# Patient Record
Sex: Male | Born: 1970
Health system: Southern US, Community
[De-identification: ages and names within clinical notes are randomized; demographics above are authoritative.]

## PROBLEM LIST (undated history)

## (undated) DIAGNOSIS — F419 Anxiety disorder, unspecified: Secondary | ICD-10-CM

## (undated) DIAGNOSIS — K5900 Constipation, unspecified: Secondary | ICD-10-CM

## (undated) DIAGNOSIS — K219 Gastro-esophageal reflux disease without esophagitis: Secondary | ICD-10-CM

## (undated) HISTORY — DX: Anxiety disorder, unspecified: F41.9

## (undated) HISTORY — PX: VASECTOMY: SHX75

---

## 2000-05-03 HISTORY — PX: VASECTOMY: SHX75

## 2004-09-06 ENCOUNTER — Emergency Department (HOSPITAL_COMMUNITY): Admission: EM | Admit: 2004-09-06 | Discharge: 2004-09-06 | Payer: Self-pay | Admitting: Emergency Medicine

## 2009-07-21 ENCOUNTER — Ambulatory Visit: Payer: Self-pay | Admitting: Internal Medicine

## 2011-02-01 DIAGNOSIS — D229 Melanocytic nevi, unspecified: Secondary | ICD-10-CM

## 2011-02-01 HISTORY — DX: Melanocytic nevi, unspecified: D22.9

## 2015-04-21 ENCOUNTER — Encounter (HOSPITAL_COMMUNITY): Payer: Self-pay | Admitting: Emergency Medicine

## 2015-04-21 ENCOUNTER — Emergency Department (HOSPITAL_COMMUNITY): Payer: 59

## 2015-04-21 ENCOUNTER — Emergency Department (HOSPITAL_COMMUNITY)
Admission: EM | Admit: 2015-04-21 | Discharge: 2015-04-21 | Disposition: A | Discharge status: Home / Self Care | Payer: 59 | Attending: Emergency Medicine | Admitting: Emergency Medicine

## 2015-04-21 DIAGNOSIS — R1013 Epigastric pain: Secondary | ICD-10-CM | POA: Insufficient documentation

## 2015-04-21 DIAGNOSIS — R070 Pain in throat: Secondary | ICD-10-CM | POA: Diagnosis not present

## 2015-04-21 DIAGNOSIS — Z8719 Personal history of other diseases of the digestive system: Secondary | ICD-10-CM | POA: Diagnosis not present

## 2015-04-21 HISTORY — DX: Gastro-esophageal reflux disease without esophagitis: K21.9

## 2015-04-21 MED ORDER — FAMOTIDINE 20 MG PO TABS
20.0000 mg | ORAL_TABLET | Freq: Two times a day (BID) | ORAL | Status: DC
Start: 2015-04-21 — End: 2018-11-27

## 2015-04-21 MED ORDER — GI COCKTAIL ~~LOC~~
30.0000 mL | Freq: Once | ORAL | Status: AC
  Administered 2015-04-21: 30 mL via ORAL
  Filled 2015-04-21: qty 30

## 2015-04-21 NOTE — ED Provider Notes (Signed)
CSN: [id number]     Arrival date & time 04/21/15  0710 History   First MD Initiated Contact with Patient 04/21/15 0715     Chief Complaint  Patient presents with  . Swallowed Foreign Body     (Consider location/radiation/quality/duration/timing/severity/associated sxs/prior Treatment) HPI Comments: The patient is a 44 year old male, he denies any medical problems other than history of prior acid reflux disease.  He no longer takes medications for this stating that he improved spontaneously and stop taking the medications when his symptoms went away. He reports that 2 days ago he was eating pizza, he felt like he had some epigastric discomfort and since that time has had some discomfort in the top of his throat, he points just superior to the sternal notch. This is persistent, worse with swallowing, better when he lays down, worse when he sits up, worse when he swallows. There is no other associated symptoms including vomiting coughing or difficulty breathing.  Patient is a 44 y.o. male presenting with foreign body swallowed. The history is provided by the patient.  Swallowed Foreign Body    Past Medical History  Diagnosis Date  . GERD (gastroesophageal reflux disease)    Past Surgical History  Procedure Laterality Date  . Vasectomy     History reviewed. No pertinent family history. Social History  Substance Use Topics  . Smoking status: Never Smoker   . Smokeless tobacco: None  . Alcohol Use: Yes     Comment: occassionally    Review of Systems  All other systems reviewed and are negative.     Allergies  Review of patient's allergies indicates no known allergies.  Home Medications   Prior to Admission medications   Medication Sig Start Date End Date Taking? Authorizing Provider  famotidine (PEPCID) 20 MG tablet Take 1 tablet (20 mg total) by mouth 2 (two) times daily. 04/21/15   Noemi Chapel, MD   BP 124/80 mmHg  Pulse 69  Temp(Src) 98.1 F (36.7 C) (Oral)  Resp  18  Ht 6\' 2"  (1.88 m)  Wt 180 lb (81.647 kg)  BMI 23.10 kg/m2  SpO2 100% Physical Exam  Constitutional: He appears well-developed and well-nourished. No distress.  HENT:  Head: Normocephalic and atraumatic.  Mouth/Throat: Oropharynx is clear and moist. No oropharyngeal exudate.  Clear oropharynx, examined with tongue depressor, inspected, visualized, no signs of foreign body, bleeding, asymmetry, swelling, edema. Phonation is normal, dentition is normal, mucous membranes are moist. Tympanic membranes were reviewed and appear normal bilaterally  Eyes: Conjunctivae and EOM are normal. Pupils are equal, round, and reactive to light. Right eye exhibits no discharge. Left eye exhibits no discharge. No scleral icterus.  Neck: Normal range of motion. Neck supple. No JVD present. No thyromegaly present.  Very supple neck, no lymphadenopathy  Cardiovascular: Normal rate, regular rhythm, normal heart sounds and intact distal pulses.  Exam reveals no gallop and no friction rub.   No murmur heard. Pulmonary/Chest: Effort normal and breath sounds normal. No respiratory distress. He has no wheezes. He has no rales.  Abdominal: Soft. Bowel sounds are normal. He exhibits no distension and no mass. There is no tenderness.  Musculoskeletal: Normal range of motion.  Lymphadenopathy:    He has no cervical adenopathy.  Neurological: He is alert. Coordination normal.  Normal gait and balance  Skin: Skin is warm and dry. No rash noted. No erythema.  Psychiatric: He has a normal mood and affect. His behavior is normal.  Nursing note and vitals reviewed.   ED  Course  Procedures (including critical care time) Labs Review Labs Reviewed - No data to display  Imaging Review Dg Neck Soft Tissue  04/21/2015  CLINICAL DATA:  The patient feels as if something is stuck in his throat since eating pizza 04/20/2015. Initial encounter. EXAM: NECK SOFT TISSUES - 1+ VIEW COMPARISON:  None. FINDINGS: There is no evidence  of retropharyngeal soft tissue swelling or epiglottic enlargement. The cervical airway is unremarkable and no radio-opaque foreign body identified. IMPRESSION: Negative. Electronically Signed   By: Inge Rise M.D.   On: 04/21/2015 07:46   I have personally reviewed and evaluated these images and lab results as part of my medical decision-making.    MDM   Final diagnoses:  Throat pain    Overall the patient is well-appearing, vital signs are normal, doubt foreign body, more likely to be acid reflux, however will image the neck to rule out foreign body or other complicating factors. The patient is in agreement with this plan.  Xray n eg, pt stable for d/c.  Meds given in ED:  Medications  gi cocktail (Maalox,Lidocaine,Donnatal) (30 mLs Oral Given 04/21/15 0729)    New Prescriptions   FAMOTIDINE (PEPCID) 20 MG TABLET    Take 1 tablet (20 mg total) by mouth 2 (two) times daily.        Noemi Chapel, MD 04/21/15 [id number]

## 2015-04-21 NOTE — ED Notes (Signed)
PT stated he ate pizza Saturday evening and ever since then he feels like something is stuck in his throat. PT denies any vomiting and is able to drink fluids. NAD noted.

## 2015-04-29 ENCOUNTER — Encounter (HOSPITAL_COMMUNITY): Payer: Self-pay | Admitting: Emergency Medicine

## 2015-04-29 DIAGNOSIS — R1013 Epigastric pain: Secondary | ICD-10-CM | POA: Insufficient documentation

## 2015-04-29 DIAGNOSIS — Z79899 Other long term (current) drug therapy: Secondary | ICD-10-CM | POA: Diagnosis not present

## 2015-04-29 DIAGNOSIS — K219 Gastro-esophageal reflux disease without esophagitis: Secondary | ICD-10-CM | POA: Diagnosis not present

## 2015-04-29 NOTE — ED Notes (Signed)
Pt c/o abd pain since Friday. Pt has seen Pcp for the same.

## 2015-04-30 ENCOUNTER — Emergency Department (HOSPITAL_COMMUNITY)
Admission: EM | Admit: 2015-04-30 | Discharge: 2015-04-30 | Disposition: A | Discharge status: Home / Self Care | Payer: 59 | Attending: Emergency Medicine | Admitting: Emergency Medicine

## 2015-04-30 DIAGNOSIS — R1013 Epigastric pain: Secondary | ICD-10-CM

## 2015-04-30 MED ORDER — HYDROCODONE-ACETAMINOPHEN 5-325 MG PO TABS: 1.0000 | ORAL_TABLET | ORAL | Status: DC | PRN

## 2015-04-30 NOTE — Discharge Instructions (Signed)
Take Nexium twice a day for 3 weeks. Return for the HIDA scan, after discussion with tomorrow morning.   Abdominal Pain, Adult Many things can cause abdominal pain. Usually, abdominal pain is not caused by a disease and will improve without treatment. It can often be observed and treated at home. Your health care provider will do a physical exam and possibly order blood tests and X-rays to help determine the seriousness of your pain. However, in many cases, more time must pass before a clear cause of the pain can be found. Before that point, your health care provider may not know if you need more testing or further treatment. HOME CARE INSTRUCTIONS Monitor your abdominal pain for any changes. The following actions may help to alleviate any discomfort you are experiencing:  Only take over-the-counter or prescription medicines as directed by your health care provider.  Do not take laxatives unless directed to do so by your health care provider.  Try a clear liquid diet (broth, tea, or water) as directed by your health care provider. Slowly move to a bland diet as tolerated. SEEK MEDICAL CARE IF:  You have unexplained abdominal pain.  You have abdominal pain associated with nausea or diarrhea.  You have pain when you urinate or have a bowel movement.  You experience abdominal pain that wakes you in the night.  You have abdominal pain that is worsened or improved by eating food.  You have abdominal pain that is worsened with eating fatty foods.  You have a fever. SEEK IMMEDIATE MEDICAL CARE IF:  Your pain does not go away within 2 hours.  You keep throwing up (vomiting).  Your pain is felt only in portions of the abdomen, such as the right side or the left lower portion of the abdomen.  You pass bloody or black tarry stools. MAKE SURE YOU:  Understand these instructions.  Will watch your condition.  Will get help right away if you are not doing well or get worse.   This  information is not intended to replace advice given to you by your health care provider. Make sure you discuss any questions you have with your health care provider.   Document Released: 01/27/2005 Document Revised: 01/08/2015 Document Reviewed: 12/27/2012 Elsevier Interactive Patient Education [employer].

## 2015-04-30 NOTE — ED Notes (Signed)
Patient states that he has been here a week ago for pain in the center of his chest and they told him it was reflux. States that he went to see Dr. Olena Heckle today and they did lab work and an ultrasound and everything was fine. States that he is having on going epigastric pain, that he had an EGD 5 years ago and it was fine. He has not been referred to GI at this time.

## 2015-04-30 NOTE — ED Provider Notes (Signed)
CSN: [id number]     Arrival date & time 04/29/15  2338 History   First MD Initiated Contact with Patient 04/30/15 0129     Chief Complaint  Patient presents with  . Abdominal Pain     (Consider location/radiation/quality/duration/timing/severity/associated sxs/prior Treatment) HPI   Dillon Payne is a 44 y.o. male who presents for evaluation of epigastric pain which is persistent for 2 weeks. It has worsened in the last 3 days. His PCP ordered an ultrasound which was done and he was told that it was normal. He is worried that he has gallbladder disease. He is taking famotidine, twice a day for 2 weeks without relief. He's tried Tums, without relief. He denies fever, nausea, vomiting, weakness, dizziness, diarrhea or constipation. He does not smoke. He is a Administrator. He has an upcoming appointment with gastroenterology on 05/07/15. There are no other known modifying factors.   Past Medical History  Diagnosis Date  . GERD (gastroesophageal reflux disease)    Past Surgical History  Procedure Laterality Date  . Vasectomy     History reviewed. No pertinent family history. Social History  Substance Use Topics  . Smoking status: Never Smoker   . Smokeless tobacco: None  . Alcohol Use: Yes     Comment: occassionally    Review of Systems  All other systems reviewed and are negative.     Allergies  Review of patient's allergies indicates no known allergies.  Home Medications   Prior to Admission medications   Medication Sig Start Date End Date Taking? Authorizing Provider  famotidine (PEPCID) 20 MG tablet Take 1 tablet (20 mg total) by mouth 2 (two) times daily. 04/21/15   Noemi Chapel, MD   BP 118/85 mmHg  Pulse 63  Temp(Src) 98 F (36.7 C) (Oral)  Resp 14  Ht 6\' 2"  (1.88 m)  Wt 180 lb (81.647 kg)  BMI 23.10 kg/m2  SpO2 100% Physical Exam  Constitutional: He is oriented to person, place, and time. He appears well-developed and well-nourished.  HENT:  Head:  Normocephalic and atraumatic.  Right Ear: External ear normal.  Left Ear: External ear normal.  Eyes: Conjunctivae and EOM are normal. Pupils are equal, round, and reactive to light.  Neck: Normal range of motion and phonation normal. Neck supple.  Cardiovascular: Normal rate, regular rhythm and normal heart sounds.   Pulmonary/Chest: Effort normal and breath sounds normal. He exhibits no bony tenderness.  Abdominal: Soft. There is tenderness (epigastric, mild). There is no rebound and no guarding.  Musculoskeletal: Normal range of motion.  Neurological: He is alert and oriented to person, place, and time. No cranial nerve deficit or sensory deficit. He exhibits normal muscle tone. Coordination normal.  Skin: Skin is warm, dry and intact.  Psychiatric: He has a normal mood and affect. His behavior is normal. Judgment and thought content normal.  Nursing note and vitals reviewed.   ED Course  Procedures (including critical care time)  Medications - No data to display  Patient Vitals for the past 24 hrs:  BP Temp Temp src Pulse Resp SpO2 Height Weight  04/29/15 2351 118/85 mmHg 98 F (36.7 C) Oral 63 14 100 % 6\' 2"  (1.88 m) 180 lb (81.647 kg)   Radiology was contacted regarding scheduling a HIDA scan. They will contact him in the morning with the time.  1:47 AM Reevaluation with update and discussion. After initial assessment and treatment, an updated evaluation reveals no change in status. Findings discussed with patient, all questions were  answered. [facility] Review Labs Reviewed - No data to display  Imaging Review No results found. I have personally reviewed and evaluated these images and lab results as part of my medical decision-making.   EKG Interpretation None      MDM   Final diagnoses:  Epigastric pain    Nonspecific epigastric pain, likely related to peptic ulcer disease. Small likelihood of dysfunctional gallbladder is the source. Doubt serious  bacterial infection, metabolic instability or impending vascular collapse.  Nursing Notes Reviewed/ Care Coordinated Applicable Imaging Reviewed Interpretation of Laboratory Data incorporated into ED treatment  The patient appears reasonably screened and/or stabilized for discharge and I doubt any other medical condition or other [facility] requiring further screening, evaluation, or treatment in the ED at this time prior to discharge.  Plan: Home Medications- Start Nexium OTC, Rx Norco; Home Treatments- rest; return here if the recommended treatment, does not improve the symptoms; Recommended follow up- HIDA scan tomorrow. GI on 05/07/15 as scheduled     Daleen Bo, MD 04/30/15 [phone]

## 2015-05-15 ENCOUNTER — Encounter (HOSPITAL_COMMUNITY): Payer: Self-pay | Admitting: Emergency Medicine

## 2015-05-15 ENCOUNTER — Emergency Department (HOSPITAL_COMMUNITY): Payer: 59

## 2015-05-15 ENCOUNTER — Emergency Department (HOSPITAL_COMMUNITY)
Admission: EM | Admit: 2015-05-15 | Discharge: 2015-05-15 | Disposition: A | Discharge status: Home / Self Care | Payer: 59 | Attending: Emergency Medicine | Admitting: Emergency Medicine

## 2015-05-15 DIAGNOSIS — K219 Gastro-esophageal reflux disease without esophagitis: Secondary | ICD-10-CM | POA: Insufficient documentation

## 2015-05-15 DIAGNOSIS — R1031 Right lower quadrant pain: Secondary | ICD-10-CM | POA: Diagnosis not present

## 2015-05-15 DIAGNOSIS — Z79899 Other long term (current) drug therapy: Secondary | ICD-10-CM | POA: Diagnosis not present

## 2015-05-15 HISTORY — DX: Constipation, unspecified: K59.00

## 2015-05-15 LAB — CBC
HCT: 43.3 % (ref 39.0–52.0)
HEMOGLOBIN: 14.9 g/dL (ref 13.0–17.0)
MCH: 30.6 pg (ref 26.0–34.0)
MCHC: 34.4 g/dL (ref 30.0–36.0)
MCV: 88.9 fL (ref 78.0–100.0)
PLATELETS: 192 10*3/uL (ref 150–400)
RBC: 4.87 MIL/uL (ref 4.22–5.81)
RDW: 13 % (ref 11.5–15.5)
WBC: 4.7 10*3/uL (ref 4.0–10.5)

## 2015-05-15 LAB — URINALYSIS, ROUTINE W REFLEX MICROSCOPIC
BILIRUBIN URINE: NEGATIVE
Glucose, UA: NEGATIVE mg/dL
Hgb urine dipstick: NEGATIVE
KETONES UR: NEGATIVE mg/dL
Leukocytes, UA: NEGATIVE
NITRITE: NEGATIVE
PROTEIN: NEGATIVE mg/dL
SPECIFIC GRAVITY, URINE: 1.018 (ref 1.005–1.030)
pH: 7 (ref 5.0–8.0)

## 2015-05-15 LAB — COMPREHENSIVE METABOLIC PANEL
ALK PHOS: 66 U/L (ref 38–126)
ALT: 15 U/L — ABNORMAL LOW (ref 17–63)
ANION GAP: 7 (ref 5–15)
AST: 18 U/L (ref 15–41)
Albumin: 3.8 g/dL (ref 3.5–5.0)
BILIRUBIN TOTAL: 0.9 mg/dL (ref 0.3–1.2)
BUN: 13 mg/dL (ref 6–20)
CALCIUM: 9.3 mg/dL (ref 8.9–10.3)
CO2: 26 mmol/L (ref 22–32)
CREATININE: 0.92 mg/dL (ref 0.61–1.24)
Chloride: 106 mmol/L (ref 101–111)
GFR calc non Af Amer: >60 mL/min (ref >60)
GLUCOSE: 99 mg/dL (ref 65–99)
Potassium: 3.9 mmol/L (ref 3.5–5.1)
Sodium: 139 mmol/L (ref 135–145)
TOTAL PROTEIN: 6.7 g/dL (ref 6.5–8.1)

## 2015-05-15 LAB — LIPASE, BLOOD: Lipase: 26 U/L (ref 11–51)

## 2015-05-15 MED ORDER — ONDANSETRON HCL 4 MG/2ML IJ SOLN
4.0000 mg | Freq: Once | INTRAMUSCULAR | Status: AC
  Administered 2015-05-15: 4 mg via INTRAVENOUS
  Filled 2015-05-15: qty 2

## 2015-05-15 MED ORDER — SODIUM CHLORIDE 0.9 % IV BOLUS (SEPSIS)
1000.0000 mL | Freq: Once | INTRAVENOUS | Status: AC
  Administered 2015-05-15: 1000 mL via INTRAVENOUS

## 2015-05-15 MED ORDER — IOHEXOL 300 MG/ML  SOLN
100.0000 mL | Freq: Once | INTRAMUSCULAR | Status: AC | PRN
  Administered 2015-05-15: 100 mL via INTRAVENOUS

## 2015-05-15 MED ORDER — MORPHINE SULFATE (PF) 4 MG/ML IV SOLN
4.0000 mg | Freq: Once | INTRAVENOUS | Status: AC
  Administered 2015-05-15: 4 mg via INTRAVENOUS
  Filled 2015-05-15: qty 1

## 2015-05-15 NOTE — ED Notes (Signed)
Patient states has been having R abdominal pain and R flank pain x 4 days.  Denies urinary symptoms.   Patient denies other symptoms.

## 2015-05-15 NOTE — ED Provider Notes (Signed)
CSN: [id number]     Arrival date & time 05/15/15  [id number] History   First MD Initiated Contact with Patient 05/15/15 1303     Chief Complaint  Patient presents with  . Abdominal Pain  . Flank Pain     (Consider location/radiation/quality/duration/timing/severity/associated sxs/prior Treatment) Patient is a 45 y.o. male presenting with abdominal pain and flank pain. The history is provided by the patient.  Abdominal Pain Pain location:  RLQ Pain quality: sharp and shooting   Pain radiates to:  Does not radiate Pain severity:  Moderate Onset quality:  Gradual Duration:  4 days Timing:  Constant Progression:  Worsening Chronicity:  Recurrent (multiple episodes after last couple months) Relieved by: rest. Worsened by:  Movement, palpation and coughing Ineffective treatments:  None tried Associated symptoms: no chest pain, no chills, no diarrhea, no fatigue, no fever, no nausea, no shortness of breath and no vomiting   Risk factors: has not had multiple surgeries and no recent hospitalization   Flank Pain Associated symptoms include abdominal pain. Pertinent negatives include no chest pain, no headaches and no shortness of breath.    44 yo M with RLQ abdominal pain.  Going on for four days.  Worse with movement, palpation, bending, coughing.  Sharp pain.  Sometimes radiates to R flank.  Denies radiation to groin.  No pain with urination.  Denies fevers, chills.  Able to eat and drink without difficulty.  Denies n/v/d.  Denies injury.  Denies dark stool or blood in stool.   Past Medical History  Diagnosis Date  . GERD (gastroesophageal reflux disease)   . Constipation    Past Surgical History  Procedure Laterality Date  . Vasectomy     No family history on file. Social History  Substance Use Topics  . Smoking status: Never Smoker   . Smokeless tobacco: None  . Alcohol Use: Yes     Comment: occassionally    Review of Systems  Constitutional: Negative for fever, chills and  fatigue.  HENT: Negative for congestion and facial swelling.   Eyes: Negative for discharge and visual disturbance.  Respiratory: Negative for shortness of breath.   Cardiovascular: Negative for chest pain and palpitations.  Gastrointestinal: Positive for abdominal pain. Negative for nausea, vomiting and diarrhea.  Genitourinary: Positive for flank pain.  Musculoskeletal: Negative for myalgias and arthralgias.  Skin: Negative for color change and rash.  Neurological: Negative for tremors, syncope and headaches.  Psychiatric/Behavioral: Negative for confusion and dysphoric mood.      Allergies  Review of patient's allergies indicates no known allergies.  Home Medications   Prior to Admission medications   Medication Sig Start Date End Date Taking? Authorizing Provider  citalopram (CELEXA) 20 MG tablet Take 20 mg by mouth daily.   Yes Historical Provider, MD  famotidine (PEPCID) 20 MG tablet Take 1 tablet (20 mg total) by mouth 2 (two) times daily. 04/21/15  Yes Noemi Chapel, MD  HYDROcodone-acetaminophen (NORCO) 5-325 MG tablet Take 1-2 tablets by mouth every 4 (four) hours as needed. Patient not taking: Reported on 05/15/2015 04/30/15   Daleen Bo, MD  HYDROcodone-acetaminophen (NORCO/VICODIN) 5-325 MG tablet Take 1-2 tablets by mouth every 4 (four) hours as needed. Patient not taking: Reported on 05/15/2015 04/30/15   Daleen Bo, MD   BP 103/72 mmHg  Pulse 66  Temp(Src) 97.5 F (36.4 C) (Oral)  Resp 20  Ht 6\' 2"  (1.88 m)  Wt 181 lb (82.101 kg)  BMI 23.23 kg/m2  SpO2 100% Physical Exam  Constitutional:  He is oriented to person, place, and time. He appears well-developed and well-nourished.  HENT:  Head: Normocephalic and atraumatic.  Eyes: EOM are normal. Pupils are equal, round, and reactive to light.  Neck: Normal range of motion. Neck supple. No JVD present.  Cardiovascular: Normal rate and regular rhythm.  Exam reveals no gallop and no friction rub.   No murmur  heard. Pulmonary/Chest: No respiratory distress. He has no wheezes.  Abdominal: He exhibits no distension. There is tenderness. There is no rebound and no guarding. Hernia confirmed negative in the right inguinal area and confirmed negative in the left inguinal area.    Genitourinary: Testes normal and penis normal. Cremasteric reflex is present. Right testis shows no mass, no swelling and no tenderness. Left testis shows no mass, no swelling and no tenderness. Circumcised.  Musculoskeletal: Normal range of motion.  Neurological: He is alert and oriented to person, place, and time.  Skin: No rash noted. No pallor.  Psychiatric: He has a normal mood and affect. His behavior is normal.  Nursing note and vitals reviewed.   ED Course  Procedures (including critical care time) Labs Review Labs Reviewed  COMPREHENSIVE METABOLIC PANEL - Abnormal; Notable for the following:    ALT 15 (*)    All other components within normal limits  LIPASE, BLOOD  CBC  URINALYSIS, ROUTINE W REFLEX MICROSCOPIC (NOT AT [facility])    Imaging Review No results found. I have personally reviewed and evaluated these images and lab results as part of my medical decision-making.   EKG Interpretation None      MDM   Final diagnoses:  Right lower quadrant abdominal pain    45 yo M with RLQ pain.  No prior surgery.  TTP about [place] with + rosvigs.  Lab eval unremarkable.  UA negative for infection.  No hematuria.  CT scan negative for acute pathology.  No hernias on exam or ct.  Feel likely musculoskeletal.  Will treat as such.  PCP follow up.    I have discussed the diagnosis/risks/treatment options with the patient and family and believe the pt to be eligible for discharge home to follow-up with PCP. We also discussed returning to the ED immediately if new or worsening sx occur. We discussed the sx which are most concerning (e.g., sudden worsening pain, fever, inability to tolerate by mouth) that necessitate  immediate return. Medications administered to the patient during their visit and any new prescriptions provided to the patient are listed below.  Medications given during this visit Medications  morphine 4 MG/ML injection 4 mg (4 mg Intravenous Given 05/15/15 1335)  ondansetron (ZOFRAN) injection 4 mg (4 mg Intravenous Given 05/15/15 1335)  sodium chloride 0.9 % bolus 1,000 mL (0 mLs Intravenous Stopped 05/15/15 1503)  iohexol (OMNIPAQUE) 300 MG/ML solution 100 mL (100 mLs Intravenous Contrast Given 05/15/15 1528)    Discharge Medication List as of 05/15/2015  4:01 PM      The patient appears reasonably screen and/or stabilized for discharge and I doubt any other medical condition or other [facility] requiring further screening, evaluation, or treatment in the ED at this time prior to discharge.      Deno Etienne, DO 05/15/15 1826

## 2015-05-15 NOTE — Discharge Instructions (Signed)

## 2017-02-02 IMAGING — CT CT ABD-PELV W/ CM
2 of 5 series · 16 of 46 positions shown, 18 images · IV contrast (Omni 300)
Comparison: 04/30/2015

CLINICAL DATA: RIGHT-side abdominal and RIGHT flank pain for 4
days, RIGHT lower quadrant pain, question appendicitis, no urinary
tract symptoms

EXAM:
CT ABDOMEN AND PELVIS WITH CONTRAST
TECHNIQUE: Multidetector CT imaging of the abdomen and pelvis was performed
using the standard protocol following bolus administration of
intravenous contrast. Sagittal and coronal MPR images reconstructed
from axial data set.
CONTRAST:  100mL OMNIPAQUE IOHEXOL 300 MG/ML SOLN IV. No oral
contrast administered.

[Series 2: a/p w/ 5mm · axial · 0.73mm/px · z∈[-618,-163]mm · 13 of 105 slices shown, 15 images]
[im 7/105  soft-tissue]
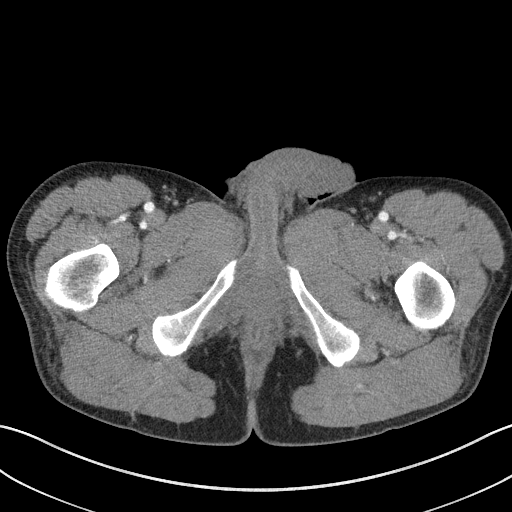
[im 7/105  bone]
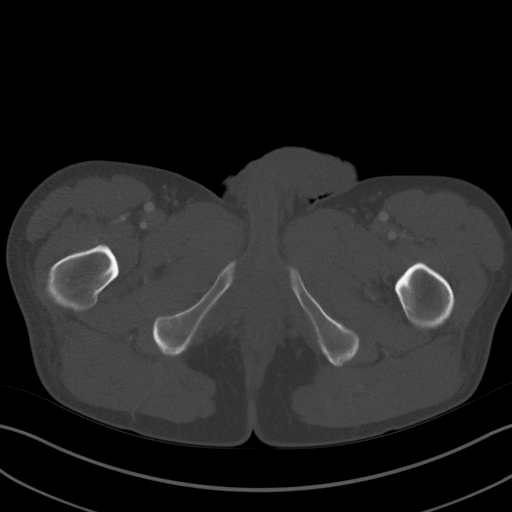
[im 13/105  soft-tissue]
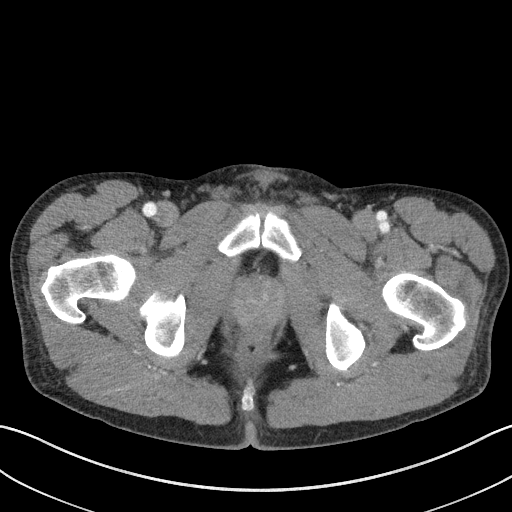
[im 25/105  soft-tissue]
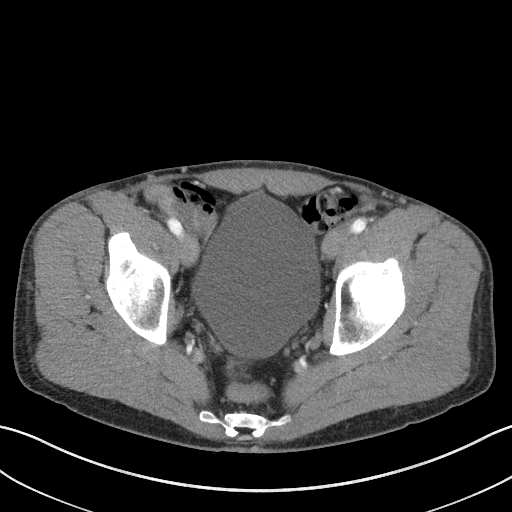
[im 31/105  soft-tissue]
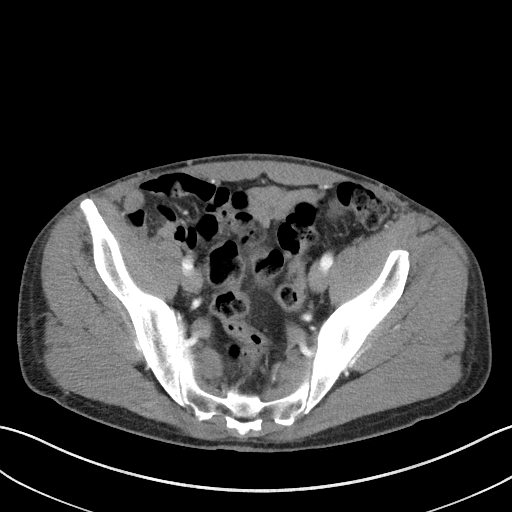
[im 37/105  soft-tissue]
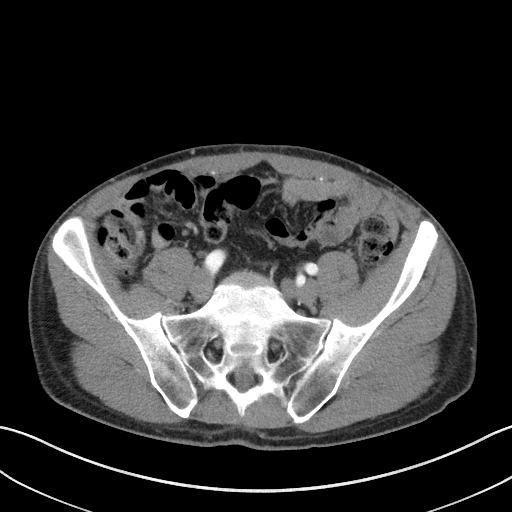
[im 43/105  soft-tissue]
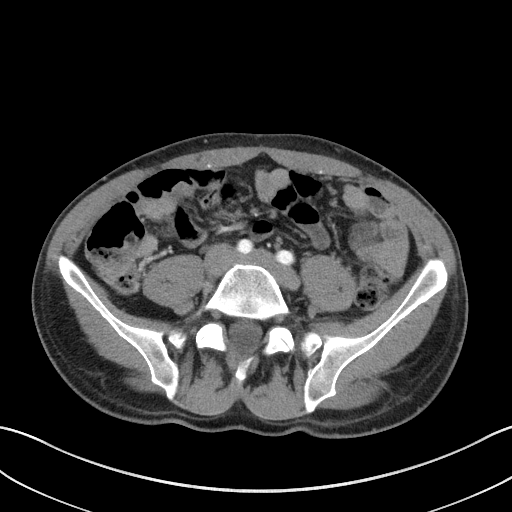
[im 56/105  soft-tissue]
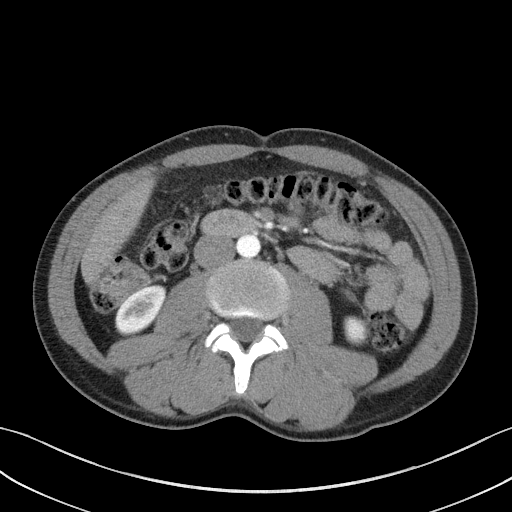
[im 62/105  soft-tissue]
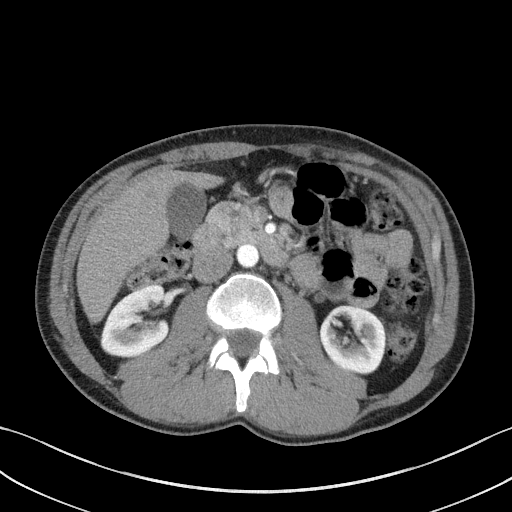
[im 68/105  soft-tissue]
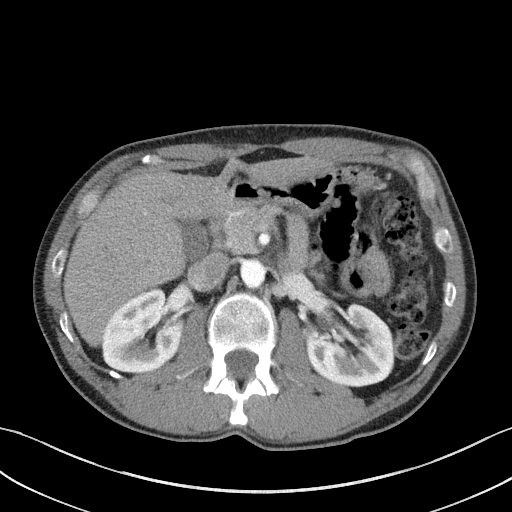
[im 68/105  bone]
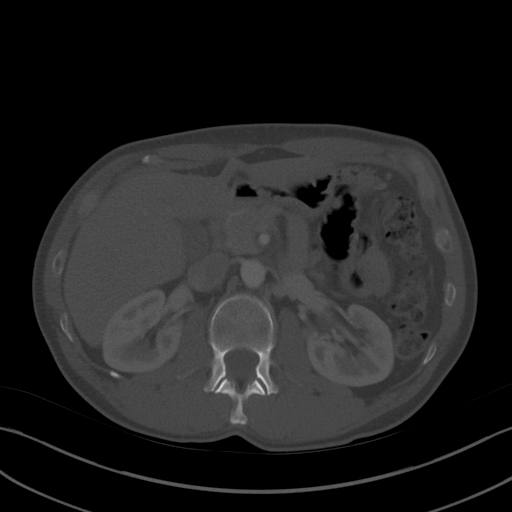
[im 74/105  soft-tissue]
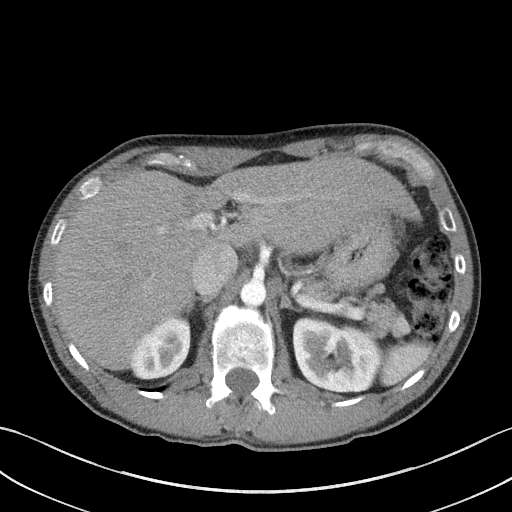
[im 80/105  soft-tissue]
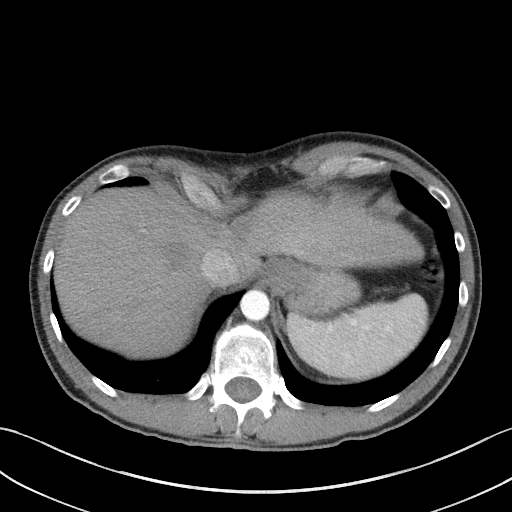
[im 92/105  soft-tissue]
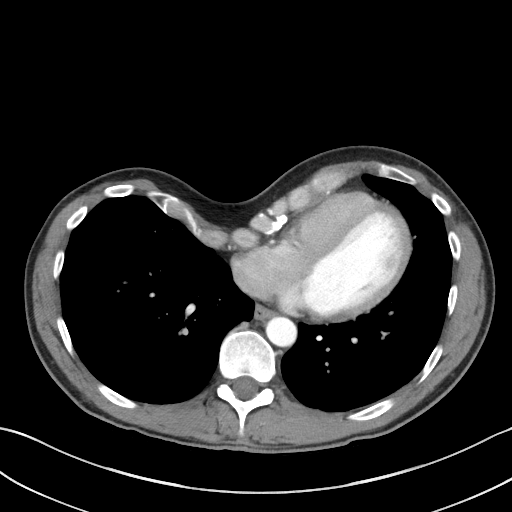
[im 98/105  soft-tissue]
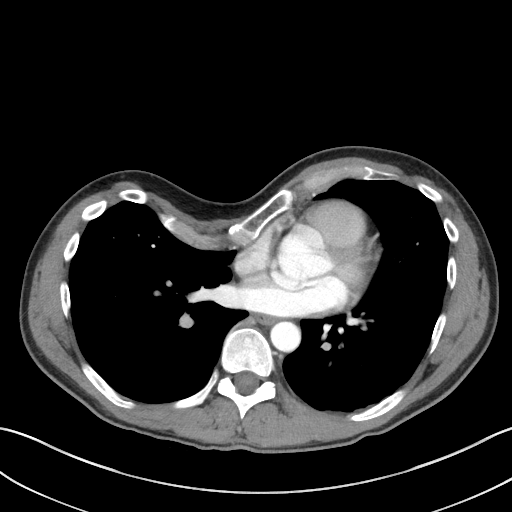

[Series 5: a/p w/ cor · coronal · 0.75mm/px · 3 of 124 slices shown]
[im 42/124  soft-tissue]
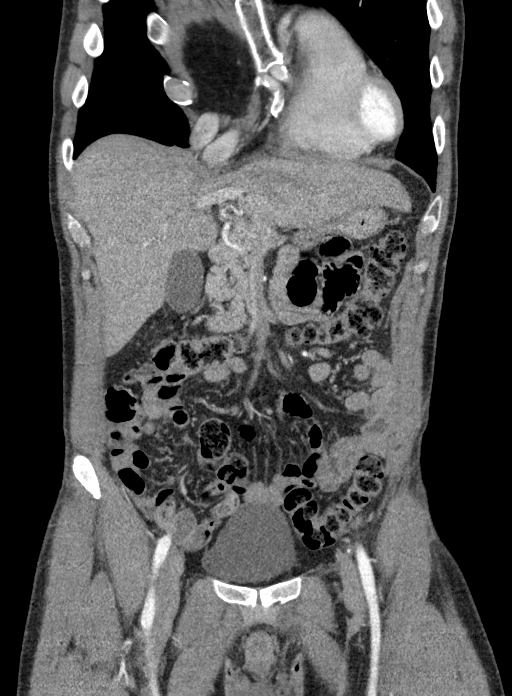
[im 55/124  soft-tissue]
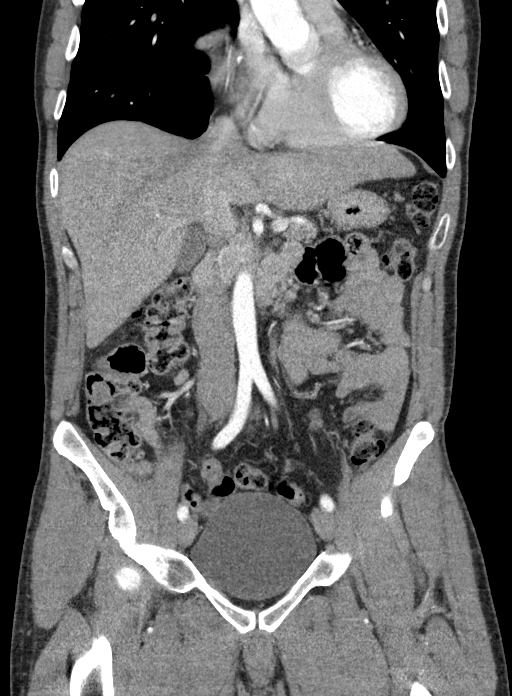
[im 69/124  soft-tissue]
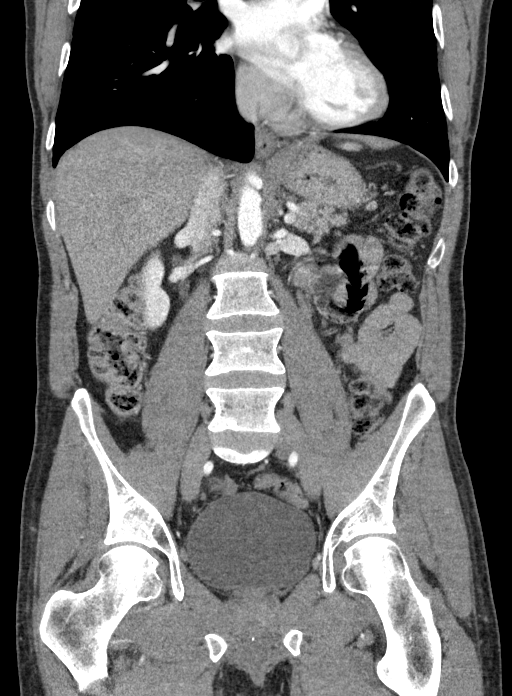

[16 of 46 positions shown; findings below may reference images not displayed]

FINDINGS: Lung bases clear.

Pectus excavatum.

Liver, gallbladder, spleen, pancreas, kidneys, and adrenal glands
normal appearance.

Normal appearing bladder and ureters.

Stomach and bowel loops grossly unremarkable for exam lacking GI
contrast and adequate distention.

Normal appendix, best visualized on coronal images.

No mass, adenopathy, free air, free fluid, or inflammatory process.

No acute osseous findings.
IMPRESSION: No acute intra-abdominal or intrapelvic abnormalities.

Pectus excavatum.

## 2018-01-23 DIAGNOSIS — Z6822 Body mass index (BMI) 22.0-22.9, adult: Secondary | ICD-10-CM | POA: Diagnosis not present

## 2018-01-23 DIAGNOSIS — J209 Acute bronchitis, unspecified: Secondary | ICD-10-CM | POA: Diagnosis not present

## 2018-04-27 DIAGNOSIS — Z0001 Encounter for general adult medical examination with abnormal findings: Secondary | ICD-10-CM | POA: Diagnosis not present

## 2018-04-27 DIAGNOSIS — Z6822 Body mass index (BMI) 22.0-22.9, adult: Secondary | ICD-10-CM | POA: Diagnosis not present

## 2018-10-03 DIAGNOSIS — L219 Seborrheic dermatitis, unspecified: Secondary | ICD-10-CM | POA: Diagnosis not present

## 2018-10-24 DIAGNOSIS — R109 Unspecified abdominal pain: Secondary | ICD-10-CM | POA: Diagnosis not present

## 2018-10-31 DIAGNOSIS — L239 Allergic contact dermatitis, unspecified cause: Secondary | ICD-10-CM | POA: Diagnosis not present

## 2018-10-31 DIAGNOSIS — L308 Other specified dermatitis: Secondary | ICD-10-CM | POA: Diagnosis not present

## 2018-10-31 DIAGNOSIS — D485 Neoplasm of uncertain behavior of skin: Secondary | ICD-10-CM | POA: Diagnosis not present

## 2018-11-27 ENCOUNTER — Ambulatory Visit (INDEPENDENT_AMBULATORY_CARE_PROVIDER_SITE_OTHER): Payer: BC Managed Care – PPO | Admitting: Podiatry

## 2018-11-27 ENCOUNTER — Ambulatory Visit (INDEPENDENT_AMBULATORY_CARE_PROVIDER_SITE_OTHER): Payer: BC Managed Care – PPO

## 2018-11-27 ENCOUNTER — Other Ambulatory Visit: Payer: Self-pay

## 2018-11-27 ENCOUNTER — Encounter: Payer: Self-pay | Admitting: Podiatry

## 2018-11-27 VITALS — BP 87/65 | HR 68 | Temp 98.3°F | Resp 16

## 2018-11-27 DIAGNOSIS — M778 Other enthesopathies, not elsewhere classified: Secondary | ICD-10-CM

## 2018-11-27 DIAGNOSIS — M779 Enthesopathy, unspecified: Secondary | ICD-10-CM

## 2018-11-27 MED ORDER — DICLOFENAC SODIUM 75 MG PO TBEC: 75.0000 mg | DELAYED_RELEASE_TABLET | Freq: Two times a day (BID) | ORAL | 2 refills | Status: DC

## 2018-11-27 NOTE — Progress Notes (Signed)
   Subjective:    Patient ID: Dillon Payne, male    DOB: 24-Aug-1970, 48 y.o.   MRN: [id number]  HPI    Review of Systems  All other systems reviewed and are negative.      Objective:   Physical Exam        Assessment & Plan:

## 2018-11-28 NOTE — Progress Notes (Signed)
Subjective:   Patient ID: Dillon Payne, male   DOB: 48 y.o.   MRN: [id number]   HPI Patient states he developed a lot of pain in his right forefoot and it is been going on for the last few days and it seems to be slightly better but it still very tender.  Patient does not smoke likes to be active   Review of Systems  All other systems reviewed and are negative.       Objective:  Physical Exam Vitals signs and nursing note reviewed.  Constitutional:      Appearance: He is well-developed.  Pulmonary:     Effort: Pulmonary effort is normal.  Musculoskeletal: Normal range of motion.  Skin:    General: Skin is warm.  Neurological:     Mental Status: He is alert.     Neurovascular status intact muscle strength is adequate range of motion within normal limits with patient found to have exquisite discomfort in the third MPJ and also the distal shaft of the third metatarsal right.  There is moderate swelling around the area but it does seem to be improving mildly from several days ago     Assessment:  Possibility for inflammatory condition versus possibility for a systemic condition or stress fracture or capsulitis     Plan:  H&P reviewed all conditions and x-rays and today I did place him on diclofenac 75 mg twice daily rigid bottom shoes ice and if symptoms do not improve we will consider either capsular injection or immobilization or blood work or combination of the above  X-rays indicate that there is no signs of fracture third metatarsal or inflammatory arthritic condition around the third MPJ

## 2019-05-03 DIAGNOSIS — L300 Nummular dermatitis: Secondary | ICD-10-CM | POA: Diagnosis not present

## 2019-10-20 DIAGNOSIS — Z6821 Body mass index (BMI) 21.0-21.9, adult: Secondary | ICD-10-CM | POA: Diagnosis not present

## 2019-10-20 DIAGNOSIS — M778 Other enthesopathies, not elsewhere classified: Secondary | ICD-10-CM | POA: Diagnosis not present

## 2020-06-25 ENCOUNTER — Telehealth: Payer: Self-pay | Admitting: Physician Assistant

## 2020-06-25 MED ORDER — TRIAMCINOLONE ACETONIDE 0.1 % EX CREA: 1.0000 "application " | TOPICAL_CREAM | Freq: Every day | CUTANEOUS | 3 refills | Status: DC

## 2020-06-25 NOTE — Telephone Encounter (Signed)
Unable to get Trianex at affordable price for patient- okay per Robyne Askew to switch to Triamcinolone- patient aware and medication sent to patient pharmacy.

## 2020-06-25 NOTE — Telephone Encounter (Signed)
Patient wants refill of trianex

## 2020-12-04 ENCOUNTER — Other Ambulatory Visit: Payer: Self-pay

## 2020-12-04 ENCOUNTER — Other Ambulatory Visit: Payer: Self-pay | Admitting: Physician Assistant

## 2020-12-04 ENCOUNTER — Ambulatory Visit: Payer: BC Managed Care – PPO | Admitting: Physician Assistant

## 2020-12-04 ENCOUNTER — Encounter: Payer: Self-pay | Admitting: Physician Assistant

## 2020-12-04 DIAGNOSIS — L564 Polymorphous light eruption: Secondary | ICD-10-CM | POA: Diagnosis not present

## 2020-12-04 MED ORDER — FLUCONAZOLE 100 MG PO TABS: 200.0000 mg | ORAL_TABLET | ORAL | 0 refills | Status: DC

## 2020-12-04 MED ORDER — CLOBETASOL PROPIONATE 0.05 % EX SOLN: 1.0000 "application " | Freq: Two times a day (BID) | CUTANEOUS | 6 refills | Status: DC

## 2020-12-04 MED ORDER — TACROLIMUS 0.1 % EX OINT: TOPICAL_OINTMENT | CUTANEOUS | 6 refills | Status: DC

## 2020-12-10 ENCOUNTER — Encounter: Payer: Self-pay | Admitting: Physician Assistant

## 2020-12-10 NOTE — Progress Notes (Signed)
   Follow-Up Visit   Subjective  Dillon Payne is a 50 y.o. male who presents for the following: Skin Problem (Patient here today for rash on his neck, scalp, face and eyelids x 2-3 weeks no household changes, no one else in the home. Per patient his eyelids are tight, itching and makes his eyes burn and water. Get worse when he is in the sun. Per patient the skin around his eyelids was peeling last night. Patient hasn't tried anything. )    The following portions of the chart were reviewed this encounter and updated as appropriate:  Tobacco  Allergies  Meds  Problems  Med Hx  Surg Hx  Fam Hx      Objective  Well appearing patient in no apparent distress; mood and affect are within normal limits.  A full examination was performed including scalp, head, eyes, ears, nose, lips, neck, chest, axillae, abdomen, back, buttocks, bilateral upper extremities, bilateral lower extremities, hands, feet, fingers, toes, fingernails, and toenails. All findings within normal limits unless otherwise noted below.  Left Posterior Neck, Left Upper Eyelid, Right Anterior Neck, Right Posterior Neck, Right Upper Eyelid Slight pink scale. A few patches of erythema.    Assessment & Plan  Polymorphic light eruption Left Upper Eyelid; Right Upper Eyelid; Right Anterior Neck; Left Posterior Neck; Right Posterior Neck  tacrolimus (PROTOPIC) 0.1 % ointment - Left Posterior Neck, Left Upper Eyelid, Right Anterior Neck, Right Posterior Neck, Right Upper Eyelid Apply to eyelids qd  clobetasol (TEMOVATE) 0.05 % external solution - Left Posterior Neck, Left Upper Eyelid, Right Anterior Neck, Right Posterior Neck, Right Upper Eyelid Apply 1 application topically 2 (two) times daily.  fluconazole (DIFLUCAN) 100 MG tablet - Left Posterior Neck, Left Upper Eyelid, Right Anterior Neck, Right Posterior Neck, Right Upper Eyelid Take 2 tablets (200 mg total) by mouth every 3 (three) days. Take I po every 3 days    I,  Derwin Reddy, PA-C, have reviewed all documentation's for this visit.  The documentation on 12/10/20 for the exam, diagnosis, procedures and orders are all accurate and complete.

## 2021-04-02 LAB — EXTERNAL GENERIC LAB PROCEDURE: COLOGUARD: NEGATIVE

## 2021-06-29 ENCOUNTER — Ambulatory Visit: Payer: BC Managed Care – PPO | Admitting: Physician Assistant

## 2021-06-29 ENCOUNTER — Encounter: Payer: Self-pay | Admitting: Physician Assistant

## 2021-06-29 ENCOUNTER — Other Ambulatory Visit: Payer: Self-pay

## 2021-06-29 ENCOUNTER — Telehealth: Payer: Self-pay

## 2021-06-29 DIAGNOSIS — L2089 Other atopic dermatitis: Secondary | ICD-10-CM

## 2021-06-29 DIAGNOSIS — L820 Inflamed seborrheic keratosis: Secondary | ICD-10-CM | POA: Diagnosis not present

## 2021-06-29 MED ORDER — DUPIXENT 300 MG/2ML ~~LOC~~ SOAJ: 600.0000 mg | Freq: Once | SUBCUTANEOUS | 0 refills | Status: DC

## 2021-06-29 MED ORDER — DUPIXENT 300 MG/2ML ~~LOC~~ SOAJ: 300.0000 mg | SUBCUTANEOUS | 6 refills | Status: DC

## 2021-06-29 NOTE — Progress Notes (Signed)
° °  Follow-Up Visit   Subjective  Dillon Payne is a 51 y.o. male who presents for the following: Follow-up (Rash has returned fold of the buttocks arms and legs tx TAC 0.1 cream red no itching. Chronic spongiotic dermatitis confirmed punch biopsy 10/31/2018 scalp and neck. Previous treatment include triamconoline cream 0.1%, clobetasol solution 0.05%, cloderm cream, betamethasone dip lotion 0.05%, ketoconazole 2%, ciclopirox 8%, Diprolene lotion, /). It was biopsy proven atopic dermatitis/ eczematous dermatitis.   The following portions of the chart were reviewed this encounter and updated as appropriate:  Tobacco   Med Hx   Surg Hx   Fam Hx   Soc Hx     Objective  Well appearing patient in no apparent distress; mood and affect are within normal limits.  A full examination was performed including scalp, head, eyes, ears, nose, lips, neck, chest, axillae, abdomen, back, buttocks, bilateral upper extremities, bilateral lower extremities, hands, feet, fingers, toes, fingernails, and toenails. All findings within normal limits unless otherwise noted below.  Torso - Posterior (Back) Thin scaly erythematous papules coalescing to plaques. Previous treatment include triamconoline cream 0.1%, clobetasol solution 0.05%, cloderm cream, betamethasone dip lotion 0.05%, ketoconazole 2%, ciclopirox 8%, Diprolene lotion without much success.   Mid Back Stuck-on, waxy papules and plaques.    Assessment & Plan  Other atopic dermatitis Torso - Posterior (Back)  Dupixent new start  Dupilumab (DUPIXENT) 300 MG/2ML SOPN - Torso - Posterior (Back) Inject 300 mg into the skin every 14 (fourteen) days. Starting at day 15 for maintenance.  Related Medications Dupilumab (DUPIXENT) 300 MG/2ML SOPN Inject 600 mg into the skin once for 1 dose. On day 1.  Seborrheic keratosis, inflamed Mid Back  Destruction of lesion - Mid Back Complexity: simple   Destruction method: cryotherapy   Informed consent:  discussed and consent obtained   Timeout:  patient name, date of birth, surgical site, and procedure verified Lesion destroyed using liquid nitrogen: Yes   Cryotherapy cycles:  3 Outcome: patient tolerated procedure well with no complications      I, Christoher Drudge, PA-C, have reviewed all documentation's for this visit.  The documentation on 06/29/21 for the exam, diagnosis, procedures and orders are all accurate and complete.

## 2021-06-29 NOTE — Telephone Encounter (Signed)
Dupixent information completed through [facility] portal. Office notes sent in as well through portal.

## 2021-06-29 NOTE — Telephone Encounter (Signed)
Dupixent enrollment form filled out and faxed to [place].

## 2021-06-29 NOTE — Patient Instructions (Signed)

## 2021-07-06 MED ORDER — DUPIXENT 300 MG/2ML ~~LOC~~ SOAJ: 300.0000 mg | SUBCUTANEOUS | 6 refills | Status: DC

## 2021-07-06 MED ORDER — DUPIXENT 300 MG/2ML ~~LOC~~ SOAJ: 600.0000 mg | Freq: Once | SUBCUTANEOUS | 0 refills | Status: AC

## 2021-07-06 NOTE — Addendum Note (Signed)
Addended by: Sheran Lawless on: 07/06/2021 02:44 PM ? ? Modules accepted: Orders ? ?

## 2021-07-06 NOTE — Telephone Encounter (Signed)
Patient stated he is improved some on his back we had to transfer his dupixent per insurance bcbs to [place] [phone] ?Fax [phone]  ?

## 2021-07-08 ENCOUNTER — Telehealth: Payer: Self-pay

## 2021-07-08 NOTE — Telephone Encounter (Signed)
Prior authorization for Dupixent done through cover my meds.  ?

## 2021-07-08 NOTE — Telephone Encounter (Signed)
Fax received from Cover My Meds for prior authorization for the patient's Dupixent.  ?

## 2021-07-13 ENCOUNTER — Telehealth: Payer: Self-pay | Admitting: *Deleted

## 2021-07-13 NOTE — Telephone Encounter (Signed)
Checked status [employer] portal.... ? ?You are chatting with Alison Murray ?Support ?[place] ?Chat Transcript  ?End Chat  ?good afternnon...need to  check status of a patient ? ? ?14:09 ?aluren - nurse ? ? ?14:09 ?Drema Eddington ? ? ?14:09 ?pt Dillon Payne [birth year] ? ? ?14:10 ?14:11[name] joined the chat ?14:11Chat assigned to [place] ?Hi! My name is Alison Murray, how may I assist you today? ? ? ?14:11 ?check status of pt ? ? ?14:11 ?Dillon Payne [birth year] ? ? ?14:11 ?It looks like PA was Denied and we are reaching out to insurance to check if there is an option to appeal. We will update office once we talk to insurance. ? ? ?14:13 ?

## 2021-07-15 ENCOUNTER — Telehealth: Payer: Self-pay | Admitting: *Deleted

## 2021-07-15 NOTE — Telephone Encounter (Signed)
Spoke with patient and he is aware of the denial by [facility] and I faxed the denial over to [place] and [employer] [phone] to hopefull move forward with the free drug program.  ?

## 2021-07-15 NOTE — Telephone Encounter (Signed)
Dupixent update via [employer] portal  ? ?Audiel Kneisley 1970-07-01 ? ? ?12:19 ?For the [place]- the PA was denied- we just received the denial letter- so the office will be getting sent the appeal checklist if the provider wants to go that route.  ?

## 2021-07-15 NOTE — Telephone Encounter (Signed)
Filled out the appeal from for dupixent and faxed it back to [employer]. ?

## 2021-08-03 ENCOUNTER — Other Ambulatory Visit: Payer: Self-pay

## 2021-08-03 DIAGNOSIS — L2089 Other atopic dermatitis: Secondary | ICD-10-CM

## 2021-08-03 MED ORDER — DUPIXENT 300 MG/2ML ~~LOC~~ SOAJ: 300.0000 mg | SUBCUTANEOUS | 6 refills | Status: AC

## 2021-09-15 NOTE — Telephone Encounter (Signed)
Appeal denied by bcbs sent in for [place] to scan in documents ?

## 2021-10-26 ENCOUNTER — Telehealth: Payer: Self-pay

## 2023-03-02 ENCOUNTER — Ambulatory Visit (AMBULATORY_SURGERY_CENTER): Payer: BC Managed Care – PPO

## 2023-03-02 ENCOUNTER — Encounter: Payer: Self-pay | Admitting: Gastroenterology

## 2023-03-02 VITALS — Ht 74.0 in | Wt 174.0 lb

## 2023-03-02 DIAGNOSIS — Z1211 Encounter for screening for malignant neoplasm of colon: Secondary | ICD-10-CM

## 2023-03-02 MED ORDER — NA SULFATE-K SULFATE-MG SULF 17.5-3.13-1.6 GM/177ML PO SOLN: 1.0000 | Freq: Once | ORAL | 0 refills | Status: AC

## 2023-03-02 NOTE — Progress Notes (Signed)
Pre visit completed via phone call; Patient verified name, DOB, and address; No egg or soy allergy known to patient;  No issues known to pt with past sedation with any surgeries or procedures; Patient denies ever being told they had issues or difficulty with intubation;  No FH of Malignant Hyperthermia; Pt is not on diet pills; Pt is not on home 02;  Pt is not on blood thinners;  Pt denies issues with constipation at this time;   No A fib or A flutter; Have any cardiac testing pending--NO Insurance verified during PV appt--- BCBS Blue Options Pt can ambulate without assistance;  Pt denies use of chewing tobacco; Discussed diabetic/weight loss medication holds; Discussed NSAID holds; Checked BMI to be less than 50; Pt instructed to use Singlecare.com or GoodRx for a price reduction on prep;  Patient's chart reviewed by Cathlyn Parsons CNRA prior to previsit and patient appropriate for the [facility]; Pre visit completed and red dot placed by patient's name on their procedure day (on provider's schedule);  Instructions sent to patient via MyChart aw well as printed and mailed to the patient per his request;

## 2023-03-24 ENCOUNTER — Encounter: Payer: Self-pay | Admitting: Gastroenterology

## 2023-03-24 ENCOUNTER — Ambulatory Visit: Payer: BC Managed Care – PPO | Admitting: Gastroenterology

## 2023-03-24 VITALS — BP 104/70 | HR 54 | Temp 97.6°F | Resp 13 | Ht 74.0 in | Wt 174.0 lb

## 2023-03-24 DIAGNOSIS — Z1211 Encounter for screening for malignant neoplasm of colon: Secondary | ICD-10-CM

## 2023-03-24 MED ORDER — SODIUM CHLORIDE 0.9 % IV SOLN: 500.0000 mL | INTRAVENOUS | Status: DC

## 2023-03-24 NOTE — Op Note (Signed)
[facility] Patient Name: Dillon Payne Procedure Date: 03/24/2023 2:17 PM MRN: [id number] Endoscopist: Lorin Picket E. Tomasa Rand , MD, [id number] Age: 52 Referring MD:  Date of Birth: 26-Apr-1971 Gender: Male Account #: [id number] Procedure:                Colonoscopy Indications:              Screening for colorectal malignant neoplasm, This                            is the patient's first colonoscopy Medicines:                Monitored Anesthesia Care Procedure:                Pre-Anesthesia Assessment:                           - Prior to the procedure, a History and Physical                            was performed, and patient medications and                            allergies were reviewed. The patient's tolerance of                            previous anesthesia was also reviewed. The risks                            and benefits of the procedure and the sedation                            options and risks were discussed with the patient.                            All questions were answered, and informed consent                            was obtained. Prior Anticoagulants: The patient has                            taken no anticoagulant or antiplatelet agents. ASA                            Grade Assessment: II - A patient with mild systemic                            disease. After reviewing the risks and benefits,                            the patient was deemed in satisfactory condition to                            undergo the procedure.  After obtaining informed consent, the colonoscope                            was passed under direct vision. Throughout the                            procedure, the patient's blood pressure, pulse, and                            oxygen saturations were monitored continuously. The                            CF [id number] #[id number] was introduced through the anus                            and advanced to  the the terminal ileum, with                            identification of the appendiceal orifice and IC                            valve. The colonoscopy was somewhat difficult due                            to significant looping and a tortuous colon.                            Successful completion of the procedure was aided by                            using manual pressure. The patient tolerated the                            procedure well. The quality of the bowel                            preparation was adequate. The terminal ileum,                            ileocecal valve, appendiceal orifice, and rectum                            were photographed. The bowel preparation used was                            SUPREP via split dose instruction. Scope In: 2:26:00 PM Scope Out: 2:41:32 PM Scope Withdrawal Time: 0 hours 9 minutes 20 seconds  Total Procedure Duration: 0 hours 15 minutes 32 seconds  Findings:                 The perianal and digital rectal examinations were                            normal. Pertinent negatives include normal  sphincter tone and no palpable rectal lesions.                           A few small-mouthed diverticula were found in the                            sigmoid colon and transverse colon.                           The exam was otherwise normal throughout the                            examined colon.                           The terminal ileum appeared normal.                           The retroflexed view of the distal rectum and anal                            verge was normal and showed no anal or rectal                            abnormalities. Complications:            No immediate complications. Estimated Blood Loss:     Estimated blood loss: none. Impression:               - Diverticulosis in the sigmoid colon and in the                            transverse colon.                           - The examined  portion of the ileum was normal.                           - The distal rectum and anal verge are normal on                            retroflexion view.                           - No specimens collected. Recommendation:           - Patient has a contact number available for                            emergencies. The signs and symptoms of potential                            delayed complications were discussed with the                            patient. Return to normal activities tomorrow.  Written discharge instructions were provided to the                            patient.                           - Resume previous diet.                           - Continue present medications.                           - Repeat colonoscopy in 10 years for screening                            purposes. Lory Galan E. Tomasa Rand, MD 03/24/2023 2:47:04 PM This report has been signed electronically.

## 2023-03-24 NOTE — Progress Notes (Signed)
[facility] History and Physical   Primary Care Physician:  Selinda Flavin, MD   Reason for Procedure:   Colon cancer screening  Plan:    Screening colonoscopy     HPI: Dillon Payne is a 52 y.o. male undergoing initial average risk screening colonoscopy.  He has no family history of colon cancer and no chronic GI symptoms.    Past Medical History:  Diagnosis Date   Anxiety    hx of   Atypical mole 05/2017   right scapula moderate   Atypical mole 04/2011   mid back mild/mod   Atypical mole 02/2011   upper right back moderate tx w/s   Atypical mole 02/2011   upper left back atypical junction lentiginous tx w/s   Constipation    GERD (gastroesophageal reflux disease)     Past Surgical History:  Procedure Laterality Date   VASECTOMY  2002    Prior to Admission medications   Medication Sig Start Date End Date Taking? Authorizing Provider  Multiple Vitamin (MULTIVITAMIN PO) Take 1 tablet by mouth daily at 6 (six) AM.   Yes [provider]  valACYclovir (VALTREX) 500 MG tablet Take 500 mg by mouth daily. 11/04/20  Yes [provider]  VITAMIN D PO Take 1 tablet by mouth daily at 6 (six) AM.   Yes [provider]  Ascorbic Acid (VITAMIN C) 1000 MG tablet Take 1,000 mg by mouth daily.    [provider]  cetirizine (ZYRTEC) 10 MG tablet Take 10 mg by mouth daily as needed for allergies.    [provider]  Dupilumab (DUPIXENT) 300 MG/2ML SOPN Inject 300 mg into the skin every 14 (fourteen) days. Starting at day 15 for maintenance. 08/03/21   Glyn Ade, PA-C    Current Outpatient Medications  Medication Sig Dispense Refill   Multiple Vitamin (MULTIVITAMIN PO) Take 1 tablet by mouth daily at 6 (six) AM.     valACYclovir (VALTREX) 500 MG tablet Take 500 mg by mouth daily.     VITAMIN D PO Take 1 tablet by mouth daily at 6 (six) AM.     Ascorbic Acid (VITAMIN C) 1000 MG tablet Take 1,000 mg by mouth daily.      cetirizine (ZYRTEC) 10 MG tablet Take 10 mg by mouth daily as needed for allergies.     Dupilumab (DUPIXENT) 300 MG/2ML SOPN Inject 300 mg into the skin every 14 (fourteen) days. Starting at day 15 for maintenance. 4 mL 6   Current Facility-Administered Medications  Medication Dose Route Frequency Provider Last Rate Last Admin   0.9 %  sodium chloride infusion  500 mL Intravenous Continuous Jenel Lucks, MD        Allergies as of 03/24/2023   (No Known Allergies)    Family History  Problem Relation Age of Onset   Colon polyps Neg Hx    Colon cancer Neg Hx    Esophageal cancer Neg Hx    Stomach cancer Neg Hx    Rectal cancer Neg Hx     Social History   Socioeconomic History   Marital status: Divorced    Spouse name: Not on file   Number of children: Not on file   Years of education: Not on file   Highest education level: Not on file  Occupational History   Not on file  Tobacco Use   Smoking status: Never   Smokeless tobacco: Never  Vaping Use   Vaping status: Never Used  Substance and  Sexual Activity   Alcohol use: Not Currently    Comment: occassionally   Drug use: No   Sexual activity: Yes  Other Topics Concern   Not on file  Social History Narrative   Not on file   Social Determinants of Health   Financial Resource Strain: Not on file  Food Insecurity: Not on file  Transportation Needs: Not on file  Physical Activity: Not on file  Stress: Not on file  Social Connections: Unknown (09/13/2021)   Received from [facility], [facility]   Social Network    Social Network: Not on file  Intimate Partner Violence: Unknown (08/06/2021)   Received from [facility], [facility]   HITS    Physically Hurt: Not on file    Insult or Talk Down To: Not on file    Threaten Physical Harm: Not on file    Scream or Curse: Not on file    Review of Systems:  All other review of systems negative except as mentioned in the HPI.  Physical Exam: Vital  signs BP 114/60   Pulse 82   Temp 97.6 F (36.4 C)   Ht 6\' 2"  (1.88 m)   Wt 174 lb (78.9 kg)   SpO2 95%   BMI 22.34 kg/m   General:   Alert,  Well-developed, well-nourished, pleasant and cooperative in NAD Airway:  Mallampati 2 Lungs:  Clear throughout to auscultation.   Heart:  Regular rate and rhythm; no murmurs, clicks, rubs,  or gallops. Abdomen:  Soft, nontender and nondistended. Normal bowel sounds.   Neuro/Psych:  Normal mood and affect. A and O x 3   Tysheka Fanguy E. Tomasa Rand, MD [facility]

## 2023-03-24 NOTE — Progress Notes (Signed)
Pt's states no medical or surgical changes since previsit or office visit. 

## 2023-03-24 NOTE — Progress Notes (Signed)
Sedate, gd SR, tolerated procedure well, VSS, report to RN 

## 2023-03-24 NOTE — Patient Instructions (Signed)
Thank you for letting us take care of your healthcare needs today. Please see handouts given to you on Diverticulosis.    YOU HAD AN ENDOSCOPIC PROCEDURE TODAY AT [facility]:   Refer to the procedure report that was given to you for any specific questions about what was found during the examination.  If the procedure report does not answer your questions, please call your gastroenterologist to clarify.  If you requested that your care partner not be given the details of your procedure findings, then the procedure report has been included in a sealed envelope for you to review at your convenience later.  YOU SHOULD EXPECT: Some feelings of bloating in the abdomen. Passage of more gas than usual.  Walking can help get rid of the air that was put into your GI tract during the procedure and reduce the bloating. If you had a lower endoscopy (such as a colonoscopy or flexible sigmoidoscopy) you may notice spotting of blood in your stool or on the toilet paper. If you underwent a bowel prep for your procedure, you may not have a normal bowel movement for a few days.  Please Note:  You might notice some irritation and congestion in your nose or some drainage.  This is from the oxygen used during your procedure.  There is no need for concern and it should clear up in a day or so.  SYMPTOMS TO REPORT IMMEDIATELY:  Following lower endoscopy (colonoscopy or flexible sigmoidoscopy):  Excessive amounts of blood in the stool  Significant tenderness or worsening of abdominal pains  Swelling of the abdomen that is new, acute  Fever of 100F or higher    For urgent or emergent issues, a gastroenterologist can be reached at any hour by calling [phone]. Do not use MyChart messaging for urgent concerns.    DIET:  We do recommend a small meal at first, but then you may proceed to your regular diet.  Drink plenty of fluids but you should avoid alcoholic beverages for 24 hours.  ACTIVITY:   You should plan to take it easy for the rest of today and you should NOT DRIVE or use heavy machinery until tomorrow (because of the sedation medicines used during the test).    FOLLOW UP: Our staff will call the number listed on your records the next business day following your procedure.  We will call around 7:15- 8:00 am to check on you and address any questions or concerns that you may have regarding the information given to you following your procedure. If we do not reach you, we will leave a message.     If any biopsies were taken you will be contacted by phone or by letter within the next 1-3 weeks.  Please call us at [phone] if you have not heard about the biopsies in 3 weeks.    SIGNATURES/CONFIDENTIALITY: You and/or your care partner have signed paperwork which will be entered into your electronic medical record.  These signatures attest to the fact that that the information above on your After Visit Summary has been reviewed and is understood.  Full responsibility of the confidentiality of this discharge information lies with you and/or your care-partner.

## 2023-03-25 ENCOUNTER — Telehealth: Payer: Self-pay | Admitting: *Deleted

## 2023-03-25 NOTE — Telephone Encounter (Signed)
  Follow up Call-     03/24/2023    1:51 PM  Call back number  Post procedure Call Back phone  # [phone]  Permission to leave phone message Yes     Patient questions:  Do you have a fever, pain , or abdominal swelling? No. Pain Score  0 *  Have you tolerated food without any problems? Yes.    Have you been able to return to your normal activities? Yes.    Do you have any questions about your discharge instructions: Diet   No. Medications  No. Follow up visit  No.  Do you have questions or concerns about your Care? No.  Actions: * If pain score is 4 or above: No action needed, pain <4.
